# Patient Record
Sex: Female | Born: 1945 | Race: White | Hispanic: No | Smoking: Former smoker
Health system: Southern US, Community
[De-identification: ages and names within clinical notes are randomized; demographics above are authoritative.]

---

## 1999-05-30 ENCOUNTER — Encounter: Payer: Self-pay | Admitting: Obstetrics and Gynecology

## 1999-05-30 ENCOUNTER — Encounter: Admission: RE | Admit: 1999-05-30 | Discharge: 1999-05-30 | Payer: Self-pay | Admitting: Obstetrics and Gynecology

## 2000-09-26 ENCOUNTER — Encounter: Payer: Self-pay | Admitting: Obstetrics and Gynecology

## 2000-09-26 ENCOUNTER — Encounter: Admission: RE | Admit: 2000-09-26 | Discharge: 2000-09-26 | Payer: Self-pay | Admitting: Obstetrics and Gynecology

## 2002-01-29 ENCOUNTER — Encounter: Payer: Self-pay | Admitting: Obstetrics and Gynecology

## 2002-01-29 ENCOUNTER — Encounter: Admission: RE | Admit: 2002-01-29 | Discharge: 2002-01-29 | Payer: Self-pay | Admitting: Obstetrics and Gynecology

## 2003-02-23 ENCOUNTER — Encounter: Admission: RE | Admit: 2003-02-23 | Discharge: 2003-02-23 | Payer: Self-pay | Admitting: Obstetrics and Gynecology

## 2004-06-22 ENCOUNTER — Encounter: Admission: RE | Admit: 2004-06-22 | Discharge: 2004-06-22 | Payer: Self-pay | Admitting: Obstetrics and Gynecology

## 2005-06-28 ENCOUNTER — Encounter: Admission: RE | Admit: 2005-06-28 | Discharge: 2005-06-28 | Payer: Self-pay | Admitting: Obstetrics and Gynecology

## 2006-07-25 ENCOUNTER — Encounter: Admission: RE | Admit: 2006-07-25 | Discharge: 2006-07-25 | Payer: Self-pay | Admitting: Obstetrics and Gynecology

## 2006-08-02 ENCOUNTER — Encounter: Admission: RE | Admit: 2006-08-02 | Discharge: 2006-08-02 | Payer: Self-pay | Admitting: Obstetrics and Gynecology

## 2010-05-14 ENCOUNTER — Encounter: Payer: Self-pay | Admitting: Obstetrics and Gynecology

## 2010-05-25 ENCOUNTER — Other Ambulatory Visit: Payer: Self-pay | Admitting: Family Medicine

## 2010-05-25 DIAGNOSIS — Z1239 Encounter for other screening for malignant neoplasm of breast: Secondary | ICD-10-CM

## 2010-06-06 ENCOUNTER — Ambulatory Visit
Admission: RE | Admit: 2010-06-06 | Discharge: 2010-06-06 | Disposition: A | Discharge status: Home / Self Care | Payer: Medicare Other | Source: Ambulatory Visit | Attending: Family Medicine | Admitting: Family Medicine

## 2010-06-06 DIAGNOSIS — Z1239 Encounter for other screening for malignant neoplasm of breast: Secondary | ICD-10-CM

## 2010-06-08 ENCOUNTER — Other Ambulatory Visit (HOSPITAL_COMMUNITY)
Admission: RE | Admit: 2010-06-08 | Discharge: 2010-06-08 | Disposition: A | Discharge status: Home / Self Care | Payer: Medicare Other | Source: Ambulatory Visit | Attending: Obstetrics and Gynecology | Admitting: Obstetrics and Gynecology

## 2010-06-08 ENCOUNTER — Other Ambulatory Visit: Payer: Self-pay | Admitting: Obstetrics and Gynecology

## 2010-06-08 DIAGNOSIS — Z124 Encounter for screening for malignant neoplasm of cervix: Secondary | ICD-10-CM | POA: Insufficient documentation

## 2010-09-14 ENCOUNTER — Other Ambulatory Visit: Payer: Self-pay | Admitting: Gastroenterology

## 2011-03-05 ENCOUNTER — Other Ambulatory Visit: Payer: Self-pay | Admitting: Oral Surgery

## 2011-05-24 DIAGNOSIS — Z23 Encounter for immunization: Secondary | ICD-10-CM | POA: Diagnosis not present

## 2011-05-24 DIAGNOSIS — L439 Lichen planus, unspecified: Secondary | ICD-10-CM | POA: Diagnosis not present

## 2011-05-24 DIAGNOSIS — Z79899 Other long term (current) drug therapy: Secondary | ICD-10-CM | POA: Diagnosis not present

## 2011-05-24 DIAGNOSIS — Z0000 Encounter for general adult medical examination without abnormal findings: Secondary | ICD-10-CM | POA: Diagnosis not present

## 2011-05-24 DIAGNOSIS — Z13220 Encounter for screening for lipoid disorders: Secondary | ICD-10-CM | POA: Diagnosis not present

## 2011-05-25 ENCOUNTER — Other Ambulatory Visit: Payer: Self-pay | Admitting: Family Medicine

## 2011-05-25 DIAGNOSIS — Z1231 Encounter for screening mammogram for malignant neoplasm of breast: Secondary | ICD-10-CM

## 2011-06-04 DIAGNOSIS — E875 Hyperkalemia: Secondary | ICD-10-CM | POA: Diagnosis not present

## 2011-06-12 ENCOUNTER — Ambulatory Visit
Admission: RE | Admit: 2011-06-12 | Discharge: 2011-06-12 | Disposition: A | Discharge status: Home / Self Care | Payer: PRIVATE HEALTH INSURANCE | Source: Ambulatory Visit | Attending: Family Medicine | Admitting: Family Medicine

## 2011-06-12 DIAGNOSIS — Z1231 Encounter for screening mammogram for malignant neoplasm of breast: Secondary | ICD-10-CM | POA: Diagnosis not present

## 2011-06-12 DIAGNOSIS — N951 Menopausal and female climacteric states: Secondary | ICD-10-CM | POA: Diagnosis not present

## 2011-06-12 DIAGNOSIS — Z13820 Encounter for screening for osteoporosis: Secondary | ICD-10-CM | POA: Diagnosis not present

## 2011-11-05 DIAGNOSIS — H00029 Hordeolum internum unspecified eye, unspecified eyelid: Secondary | ICD-10-CM | POA: Diagnosis not present

## 2011-11-13 DIAGNOSIS — M6740 Ganglion, unspecified site: Secondary | ICD-10-CM | POA: Diagnosis not present

## 2011-11-13 DIAGNOSIS — B070 Plantar wart: Secondary | ICD-10-CM | POA: Diagnosis not present

## 2011-11-22 DIAGNOSIS — E7800 Pure hypercholesterolemia, unspecified: Secondary | ICD-10-CM | POA: Diagnosis not present

## 2012-03-25 ENCOUNTER — Ambulatory Visit (INDEPENDENT_AMBULATORY_CARE_PROVIDER_SITE_OTHER): Payer: Self-pay

## 2012-03-25 DIAGNOSIS — F4320 Adjustment disorder, unspecified: Secondary | ICD-10-CM

## 2012-07-21 ENCOUNTER — Other Ambulatory Visit: Payer: Self-pay

## 2012-07-21 DIAGNOSIS — R030 Elevated blood-pressure reading, without diagnosis of hypertension: Secondary | ICD-10-CM | POA: Diagnosis not present

## 2012-07-21 DIAGNOSIS — Z1231 Encounter for screening mammogram for malignant neoplasm of breast: Secondary | ICD-10-CM

## 2012-07-21 DIAGNOSIS — G479 Sleep disorder, unspecified: Secondary | ICD-10-CM | POA: Diagnosis not present

## 2012-07-21 DIAGNOSIS — Z23 Encounter for immunization: Secondary | ICD-10-CM | POA: Diagnosis not present

## 2012-07-21 DIAGNOSIS — E7800 Pure hypercholesterolemia, unspecified: Secondary | ICD-10-CM | POA: Diagnosis not present

## 2012-07-21 DIAGNOSIS — Z0000 Encounter for general adult medical examination without abnormal findings: Secondary | ICD-10-CM | POA: Diagnosis not present

## 2012-07-21 DIAGNOSIS — L439 Lichen planus, unspecified: Secondary | ICD-10-CM | POA: Diagnosis not present

## 2012-09-01 ENCOUNTER — Ambulatory Visit
Admission: RE | Admit: 2012-09-01 | Discharge: 2012-09-01 | Disposition: A | Discharge status: Home / Self Care | Payer: Medicare Other | Source: Ambulatory Visit

## 2012-09-01 DIAGNOSIS — Z1231 Encounter for screening mammogram for malignant neoplasm of breast: Secondary | ICD-10-CM | POA: Diagnosis not present

## 2012-12-10 DIAGNOSIS — E7800 Pure hypercholesterolemia, unspecified: Secondary | ICD-10-CM | POA: Diagnosis not present

## 2013-05-06 DIAGNOSIS — J029 Acute pharyngitis, unspecified: Secondary | ICD-10-CM | POA: Diagnosis not present

## 2013-05-06 DIAGNOSIS — R509 Fever, unspecified: Secondary | ICD-10-CM | POA: Diagnosis not present

## 2013-05-06 DIAGNOSIS — L439 Lichen planus, unspecified: Secondary | ICD-10-CM | POA: Diagnosis not present

## 2013-05-06 DIAGNOSIS — L280 Lichen simplex chronicus: Secondary | ICD-10-CM | POA: Diagnosis not present

## 2013-07-27 DIAGNOSIS — Z0000 Encounter for general adult medical examination without abnormal findings: Secondary | ICD-10-CM | POA: Diagnosis not present

## 2013-07-27 DIAGNOSIS — G479 Sleep disorder, unspecified: Secondary | ICD-10-CM | POA: Diagnosis not present

## 2013-07-27 DIAGNOSIS — R030 Elevated blood-pressure reading, without diagnosis of hypertension: Secondary | ICD-10-CM | POA: Diagnosis not present

## 2013-07-27 DIAGNOSIS — E7800 Pure hypercholesterolemia, unspecified: Secondary | ICD-10-CM | POA: Diagnosis not present

## 2013-07-27 DIAGNOSIS — L439 Lichen planus, unspecified: Secondary | ICD-10-CM | POA: Diagnosis not present

## 2013-07-27 DIAGNOSIS — Z23 Encounter for immunization: Secondary | ICD-10-CM | POA: Diagnosis not present

## 2013-07-28 ENCOUNTER — Other Ambulatory Visit: Payer: Self-pay

## 2013-07-28 DIAGNOSIS — Z1231 Encounter for screening mammogram for malignant neoplasm of breast: Secondary | ICD-10-CM

## 2013-08-18 DIAGNOSIS — L439 Lichen planus, unspecified: Secondary | ICD-10-CM | POA: Diagnosis not present

## 2013-08-18 DIAGNOSIS — K130 Diseases of lips: Secondary | ICD-10-CM | POA: Diagnosis not present

## 2013-08-18 DIAGNOSIS — L738 Other specified follicular disorders: Secondary | ICD-10-CM | POA: Diagnosis not present

## 2013-08-20 DIAGNOSIS — L819 Disorder of pigmentation, unspecified: Secondary | ICD-10-CM | POA: Diagnosis not present

## 2013-08-20 DIAGNOSIS — D1801 Hemangioma of skin and subcutaneous tissue: Secondary | ICD-10-CM | POA: Diagnosis not present

## 2013-08-20 DIAGNOSIS — D239 Other benign neoplasm of skin, unspecified: Secondary | ICD-10-CM | POA: Diagnosis not present

## 2013-08-20 DIAGNOSIS — L719 Rosacea, unspecified: Secondary | ICD-10-CM | POA: Diagnosis not present

## 2013-09-07 ENCOUNTER — Encounter (INDEPENDENT_AMBULATORY_CARE_PROVIDER_SITE_OTHER): Payer: Self-pay

## 2013-09-07 ENCOUNTER — Ambulatory Visit
Admission: RE | Admit: 2013-09-07 | Discharge: 2013-09-07 | Disposition: A | Discharge status: Home / Self Care | Payer: Medicare Other | Source: Ambulatory Visit

## 2013-09-07 DIAGNOSIS — Z1231 Encounter for screening mammogram for malignant neoplasm of breast: Secondary | ICD-10-CM | POA: Diagnosis not present

## 2014-03-26 DIAGNOSIS — R197 Diarrhea, unspecified: Secondary | ICD-10-CM | POA: Diagnosis not present

## 2014-03-26 DIAGNOSIS — B029 Zoster without complications: Secondary | ICD-10-CM | POA: Diagnosis not present

## 2014-03-26 DIAGNOSIS — Z23 Encounter for immunization: Secondary | ICD-10-CM | POA: Diagnosis not present

## 2014-06-08 DIAGNOSIS — H2513 Age-related nuclear cataract, bilateral: Secondary | ICD-10-CM | POA: Diagnosis not present

## 2014-07-30 ENCOUNTER — Other Ambulatory Visit: Payer: Self-pay

## 2014-07-30 DIAGNOSIS — Z1231 Encounter for screening mammogram for malignant neoplasm of breast: Secondary | ICD-10-CM

## 2014-08-05 DIAGNOSIS — J309 Allergic rhinitis, unspecified: Secondary | ICD-10-CM | POA: Diagnosis not present

## 2014-08-05 DIAGNOSIS — R509 Fever, unspecified: Secondary | ICD-10-CM | POA: Diagnosis not present

## 2014-08-05 DIAGNOSIS — Z0001 Encounter for general adult medical examination with abnormal findings: Secondary | ICD-10-CM | POA: Diagnosis not present

## 2014-08-05 DIAGNOSIS — B349 Viral infection, unspecified: Secondary | ICD-10-CM | POA: Diagnosis not present

## 2014-08-05 DIAGNOSIS — E780 Pure hypercholesterolemia: Secondary | ICD-10-CM | POA: Diagnosis not present

## 2014-08-05 DIAGNOSIS — I10 Essential (primary) hypertension: Secondary | ICD-10-CM | POA: Diagnosis not present

## 2014-08-05 DIAGNOSIS — L439 Lichen planus, unspecified: Secondary | ICD-10-CM | POA: Diagnosis not present

## 2014-08-05 DIAGNOSIS — R7301 Impaired fasting glucose: Secondary | ICD-10-CM | POA: Diagnosis not present

## 2014-08-05 DIAGNOSIS — K580 Irritable bowel syndrome with diarrhea: Secondary | ICD-10-CM | POA: Diagnosis not present

## 2014-08-11 ENCOUNTER — Ambulatory Visit: Payer: Self-pay

## 2014-08-19 DIAGNOSIS — D1801 Hemangioma of skin and subcutaneous tissue: Secondary | ICD-10-CM | POA: Diagnosis not present

## 2014-08-19 DIAGNOSIS — K130 Diseases of lips: Secondary | ICD-10-CM | POA: Diagnosis not present

## 2014-08-19 DIAGNOSIS — D225 Melanocytic nevi of trunk: Secondary | ICD-10-CM | POA: Diagnosis not present

## 2014-08-19 DIAGNOSIS — L814 Other melanin hyperpigmentation: Secondary | ICD-10-CM | POA: Diagnosis not present

## 2014-08-19 DIAGNOSIS — L433 Subacute (active) lichen planus: Secondary | ICD-10-CM | POA: Diagnosis not present

## 2014-08-19 DIAGNOSIS — D2272 Melanocytic nevi of left lower limb, including hip: Secondary | ICD-10-CM | POA: Diagnosis not present

## 2014-09-10 ENCOUNTER — Ambulatory Visit
Admission: RE | Admit: 2014-09-10 | Discharge: 2014-09-10 | Disposition: A | Discharge status: Home / Self Care | Payer: Medicare Other | Source: Ambulatory Visit

## 2014-09-10 DIAGNOSIS — Z1231 Encounter for screening mammogram for malignant neoplasm of breast: Secondary | ICD-10-CM

## 2015-02-04 DIAGNOSIS — I10 Essential (primary) hypertension: Secondary | ICD-10-CM | POA: Diagnosis not present

## 2015-02-04 DIAGNOSIS — R7301 Impaired fasting glucose: Secondary | ICD-10-CM | POA: Diagnosis not present

## 2015-02-04 DIAGNOSIS — E7800 Pure hypercholesterolemia, unspecified: Secondary | ICD-10-CM | POA: Diagnosis not present

## 2015-02-04 DIAGNOSIS — Z23 Encounter for immunization: Secondary | ICD-10-CM | POA: Diagnosis not present

## 2015-02-18 DIAGNOSIS — N179 Acute kidney failure, unspecified: Secondary | ICD-10-CM | POA: Diagnosis not present

## 2015-05-12 DIAGNOSIS — E782 Mixed hyperlipidemia: Secondary | ICD-10-CM | POA: Diagnosis not present

## 2015-08-09 ENCOUNTER — Other Ambulatory Visit: Payer: Self-pay

## 2015-08-09 DIAGNOSIS — Z1231 Encounter for screening mammogram for malignant neoplasm of breast: Secondary | ICD-10-CM

## 2015-08-18 DIAGNOSIS — D225 Melanocytic nevi of trunk: Secondary | ICD-10-CM | POA: Diagnosis not present

## 2015-08-18 DIAGNOSIS — L853 Xerosis cutis: Secondary | ICD-10-CM | POA: Diagnosis not present

## 2015-08-18 DIAGNOSIS — L814 Other melanin hyperpigmentation: Secondary | ICD-10-CM | POA: Diagnosis not present

## 2015-08-18 DIAGNOSIS — L433 Subacute (active) lichen planus: Secondary | ICD-10-CM | POA: Diagnosis not present

## 2015-08-18 DIAGNOSIS — L821 Other seborrheic keratosis: Secondary | ICD-10-CM | POA: Diagnosis not present

## 2015-08-18 DIAGNOSIS — D1801 Hemangioma of skin and subcutaneous tissue: Secondary | ICD-10-CM | POA: Diagnosis not present

## 2015-08-25 DIAGNOSIS — J309 Allergic rhinitis, unspecified: Secondary | ICD-10-CM | POA: Diagnosis not present

## 2015-08-25 DIAGNOSIS — K580 Irritable bowel syndrome with diarrhea: Secondary | ICD-10-CM | POA: Diagnosis not present

## 2015-08-25 DIAGNOSIS — R7301 Impaired fasting glucose: Secondary | ICD-10-CM | POA: Diagnosis not present

## 2015-08-25 DIAGNOSIS — E782 Mixed hyperlipidemia: Secondary | ICD-10-CM | POA: Diagnosis not present

## 2015-08-25 DIAGNOSIS — I10 Essential (primary) hypertension: Secondary | ICD-10-CM | POA: Diagnosis not present

## 2015-08-25 DIAGNOSIS — L439 Lichen planus, unspecified: Secondary | ICD-10-CM | POA: Diagnosis not present

## 2015-08-25 DIAGNOSIS — Z0000 Encounter for general adult medical examination without abnormal findings: Secondary | ICD-10-CM | POA: Diagnosis not present

## 2015-09-07 DIAGNOSIS — H524 Presbyopia: Secondary | ICD-10-CM | POA: Diagnosis not present

## 2015-09-07 DIAGNOSIS — H5203 Hypermetropia, bilateral: Secondary | ICD-10-CM | POA: Diagnosis not present

## 2015-09-07 DIAGNOSIS — N179 Acute kidney failure, unspecified: Secondary | ICD-10-CM | POA: Diagnosis not present

## 2015-09-07 DIAGNOSIS — H52203 Unspecified astigmatism, bilateral: Secondary | ICD-10-CM | POA: Diagnosis not present

## 2015-09-07 DIAGNOSIS — H2513 Age-related nuclear cataract, bilateral: Secondary | ICD-10-CM | POA: Diagnosis not present

## 2015-09-07 DIAGNOSIS — N183 Chronic kidney disease, stage 3 (moderate): Secondary | ICD-10-CM | POA: Diagnosis not present

## 2015-09-09 DIAGNOSIS — Z86010 Personal history of colonic polyps: Secondary | ICD-10-CM | POA: Diagnosis not present

## 2015-09-09 DIAGNOSIS — R197 Diarrhea, unspecified: Secondary | ICD-10-CM | POA: Diagnosis not present

## 2015-09-13 ENCOUNTER — Ambulatory Visit
Admission: RE | Admit: 2015-09-13 | Discharge: 2015-09-13 | Disposition: A | Discharge status: Home / Self Care | Payer: Medicare Other | Source: Ambulatory Visit

## 2015-09-13 DIAGNOSIS — Z1231 Encounter for screening mammogram for malignant neoplasm of breast: Secondary | ICD-10-CM

## 2015-09-16 ENCOUNTER — Other Ambulatory Visit: Payer: Self-pay | Admitting: Gastroenterology

## 2015-09-16 DIAGNOSIS — K648 Other hemorrhoids: Secondary | ICD-10-CM | POA: Diagnosis not present

## 2015-09-16 DIAGNOSIS — D126 Benign neoplasm of colon, unspecified: Secondary | ICD-10-CM | POA: Diagnosis not present

## 2015-09-16 DIAGNOSIS — D122 Benign neoplasm of ascending colon: Secondary | ICD-10-CM | POA: Diagnosis not present

## 2015-09-16 DIAGNOSIS — K5730 Diverticulosis of large intestine without perforation or abscess without bleeding: Secondary | ICD-10-CM | POA: Diagnosis not present

## 2015-09-16 DIAGNOSIS — R197 Diarrhea, unspecified: Secondary | ICD-10-CM | POA: Diagnosis not present

## 2015-09-16 DIAGNOSIS — D123 Benign neoplasm of transverse colon: Secondary | ICD-10-CM | POA: Diagnosis not present

## 2016-03-01 DIAGNOSIS — K580 Irritable bowel syndrome with diarrhea: Secondary | ICD-10-CM | POA: Diagnosis not present

## 2016-03-01 DIAGNOSIS — L439 Lichen planus, unspecified: Secondary | ICD-10-CM | POA: Diagnosis not present

## 2016-03-01 DIAGNOSIS — R7301 Impaired fasting glucose: Secondary | ICD-10-CM | POA: Diagnosis not present

## 2016-03-01 DIAGNOSIS — Z23 Encounter for immunization: Secondary | ICD-10-CM | POA: Diagnosis not present

## 2016-03-01 DIAGNOSIS — E782 Mixed hyperlipidemia: Secondary | ICD-10-CM | POA: Diagnosis not present

## 2016-03-01 DIAGNOSIS — I10 Essential (primary) hypertension: Secondary | ICD-10-CM | POA: Diagnosis not present

## 2016-08-06 ENCOUNTER — Other Ambulatory Visit: Payer: Self-pay | Admitting: Family Medicine

## 2016-08-06 DIAGNOSIS — Z1231 Encounter for screening mammogram for malignant neoplasm of breast: Secondary | ICD-10-CM

## 2016-08-29 DIAGNOSIS — K580 Irritable bowel syndrome with diarrhea: Secondary | ICD-10-CM | POA: Diagnosis not present

## 2016-08-29 DIAGNOSIS — Z0000 Encounter for general adult medical examination without abnormal findings: Secondary | ICD-10-CM | POA: Diagnosis not present

## 2016-08-29 DIAGNOSIS — I10 Essential (primary) hypertension: Secondary | ICD-10-CM | POA: Diagnosis not present

## 2016-08-29 DIAGNOSIS — R7301 Impaired fasting glucose: Secondary | ICD-10-CM | POA: Diagnosis not present

## 2016-08-29 DIAGNOSIS — E782 Mixed hyperlipidemia: Secondary | ICD-10-CM | POA: Diagnosis not present

## 2016-08-29 DIAGNOSIS — L439 Lichen planus, unspecified: Secondary | ICD-10-CM | POA: Diagnosis not present

## 2016-08-29 DIAGNOSIS — J309 Allergic rhinitis, unspecified: Secondary | ICD-10-CM | POA: Diagnosis not present

## 2016-08-29 DIAGNOSIS — Z1159 Encounter for screening for other viral diseases: Secondary | ICD-10-CM | POA: Diagnosis not present

## 2016-09-13 ENCOUNTER — Ambulatory Visit
Admission: RE | Admit: 2016-09-13 | Discharge: 2016-09-13 | Disposition: A | Discharge status: Home / Self Care | Payer: Medicare Other | Source: Ambulatory Visit | Attending: Family Medicine | Admitting: Family Medicine

## 2016-09-13 DIAGNOSIS — Z1231 Encounter for screening mammogram for malignant neoplasm of breast: Secondary | ICD-10-CM

## 2016-09-13 DIAGNOSIS — D1801 Hemangioma of skin and subcutaneous tissue: Secondary | ICD-10-CM | POA: Diagnosis not present

## 2016-09-13 DIAGNOSIS — L433 Subacute (active) lichen planus: Secondary | ICD-10-CM | POA: Diagnosis not present

## 2016-09-13 DIAGNOSIS — D225 Melanocytic nevi of trunk: Secondary | ICD-10-CM | POA: Diagnosis not present

## 2016-09-13 DIAGNOSIS — L821 Other seborrheic keratosis: Secondary | ICD-10-CM | POA: Diagnosis not present

## 2016-09-13 DIAGNOSIS — L814 Other melanin hyperpigmentation: Secondary | ICD-10-CM | POA: Diagnosis not present

## 2016-11-08 DIAGNOSIS — Z780 Asymptomatic menopausal state: Secondary | ICD-10-CM | POA: Diagnosis not present

## 2016-11-08 DIAGNOSIS — M8588 Other specified disorders of bone density and structure, other site: Secondary | ICD-10-CM | POA: Diagnosis not present

## 2017-03-29 DIAGNOSIS — R7301 Impaired fasting glucose: Secondary | ICD-10-CM | POA: Diagnosis not present

## 2017-03-29 DIAGNOSIS — E782 Mixed hyperlipidemia: Secondary | ICD-10-CM | POA: Diagnosis not present

## 2017-03-29 DIAGNOSIS — R202 Paresthesia of skin: Secondary | ICD-10-CM | POA: Diagnosis not present

## 2017-03-29 DIAGNOSIS — I10 Essential (primary) hypertension: Secondary | ICD-10-CM | POA: Diagnosis not present

## 2017-03-29 DIAGNOSIS — Z23 Encounter for immunization: Secondary | ICD-10-CM | POA: Diagnosis not present

## 2017-08-22 ENCOUNTER — Other Ambulatory Visit: Payer: Self-pay | Admitting: Advanced Practice Midwife

## 2017-08-22 DIAGNOSIS — Z1231 Encounter for screening mammogram for malignant neoplasm of breast: Secondary | ICD-10-CM

## 2017-09-12 DIAGNOSIS — K529 Noninfective gastroenteritis and colitis, unspecified: Secondary | ICD-10-CM | POA: Diagnosis not present

## 2017-09-12 DIAGNOSIS — I10 Essential (primary) hypertension: Secondary | ICD-10-CM | POA: Diagnosis not present

## 2017-09-12 DIAGNOSIS — L439 Lichen planus, unspecified: Secondary | ICD-10-CM | POA: Diagnosis not present

## 2017-09-12 DIAGNOSIS — J309 Allergic rhinitis, unspecified: Secondary | ICD-10-CM | POA: Diagnosis not present

## 2017-09-12 DIAGNOSIS — K580 Irritable bowel syndrome with diarrhea: Secondary | ICD-10-CM | POA: Diagnosis not present

## 2017-09-12 DIAGNOSIS — E782 Mixed hyperlipidemia: Secondary | ICD-10-CM | POA: Diagnosis not present

## 2017-09-12 DIAGNOSIS — Z0000 Encounter for general adult medical examination without abnormal findings: Secondary | ICD-10-CM | POA: Diagnosis not present

## 2017-09-12 DIAGNOSIS — R7301 Impaired fasting glucose: Secondary | ICD-10-CM | POA: Diagnosis not present

## 2017-09-18 DIAGNOSIS — D1801 Hemangioma of skin and subcutaneous tissue: Secondary | ICD-10-CM | POA: Diagnosis not present

## 2017-09-18 DIAGNOSIS — D225 Melanocytic nevi of trunk: Secondary | ICD-10-CM | POA: Diagnosis not present

## 2017-09-18 DIAGNOSIS — L821 Other seborrheic keratosis: Secondary | ICD-10-CM | POA: Diagnosis not present

## 2017-09-18 DIAGNOSIS — D692 Other nonthrombocytopenic purpura: Secondary | ICD-10-CM | POA: Diagnosis not present

## 2017-09-18 DIAGNOSIS — L433 Subacute (active) lichen planus: Secondary | ICD-10-CM | POA: Diagnosis not present

## 2017-09-18 DIAGNOSIS — D224 Melanocytic nevi of scalp and neck: Secondary | ICD-10-CM | POA: Diagnosis not present

## 2017-09-19 ENCOUNTER — Ambulatory Visit
Admission: RE | Admit: 2017-09-19 | Discharge: 2017-09-19 | Disposition: A | Discharge status: Home / Self Care | Payer: Medicare Other | Source: Ambulatory Visit | Attending: Advanced Practice Midwife | Admitting: Advanced Practice Midwife

## 2017-09-19 DIAGNOSIS — Z1231 Encounter for screening mammogram for malignant neoplasm of breast: Secondary | ICD-10-CM | POA: Diagnosis not present

## 2018-01-01 DIAGNOSIS — Z23 Encounter for immunization: Secondary | ICD-10-CM | POA: Diagnosis not present

## 2018-03-17 DIAGNOSIS — E782 Mixed hyperlipidemia: Secondary | ICD-10-CM | POA: Diagnosis not present

## 2018-03-17 DIAGNOSIS — I10 Essential (primary) hypertension: Secondary | ICD-10-CM | POA: Diagnosis not present

## 2018-03-17 DIAGNOSIS — F4321 Adjustment disorder with depressed mood: Secondary | ICD-10-CM | POA: Diagnosis not present

## 2018-03-17 DIAGNOSIS — R7301 Impaired fasting glucose: Secondary | ICD-10-CM | POA: Diagnosis not present

## 2018-03-17 DIAGNOSIS — M791 Myalgia, unspecified site: Secondary | ICD-10-CM | POA: Diagnosis not present

## 2018-03-31 DIAGNOSIS — N179 Acute kidney failure, unspecified: Secondary | ICD-10-CM | POA: Diagnosis not present

## 2018-05-01 DIAGNOSIS — H5203 Hypermetropia, bilateral: Secondary | ICD-10-CM | POA: Diagnosis not present

## 2018-05-01 DIAGNOSIS — H2513 Age-related nuclear cataract, bilateral: Secondary | ICD-10-CM | POA: Diagnosis not present

## 2018-06-18 DIAGNOSIS — H25812 Combined forms of age-related cataract, left eye: Secondary | ICD-10-CM | POA: Diagnosis not present

## 2018-06-18 DIAGNOSIS — H2512 Age-related nuclear cataract, left eye: Secondary | ICD-10-CM | POA: Diagnosis not present

## 2018-08-18 ENCOUNTER — Other Ambulatory Visit: Payer: Self-pay | Admitting: Family Medicine

## 2018-08-18 DIAGNOSIS — Z1231 Encounter for screening mammogram for malignant neoplasm of breast: Secondary | ICD-10-CM

## 2018-09-09 DIAGNOSIS — D1801 Hemangioma of skin and subcutaneous tissue: Secondary | ICD-10-CM | POA: Diagnosis not present

## 2018-09-09 DIAGNOSIS — L821 Other seborrheic keratosis: Secondary | ICD-10-CM | POA: Diagnosis not present

## 2018-09-09 DIAGNOSIS — L820 Inflamed seborrheic keratosis: Secondary | ICD-10-CM | POA: Diagnosis not present

## 2018-09-09 DIAGNOSIS — D225 Melanocytic nevi of trunk: Secondary | ICD-10-CM | POA: Diagnosis not present

## 2018-09-09 DIAGNOSIS — L814 Other melanin hyperpigmentation: Secondary | ICD-10-CM | POA: Diagnosis not present

## 2018-09-11 DIAGNOSIS — E782 Mixed hyperlipidemia: Secondary | ICD-10-CM | POA: Diagnosis not present

## 2018-09-11 DIAGNOSIS — K580 Irritable bowel syndrome with diarrhea: Secondary | ICD-10-CM | POA: Diagnosis not present

## 2018-09-11 DIAGNOSIS — R7301 Impaired fasting glucose: Secondary | ICD-10-CM | POA: Diagnosis not present

## 2018-09-11 DIAGNOSIS — I10 Essential (primary) hypertension: Secondary | ICD-10-CM | POA: Diagnosis not present

## 2018-09-17 DIAGNOSIS — L439 Lichen planus, unspecified: Secondary | ICD-10-CM | POA: Diagnosis not present

## 2018-09-17 DIAGNOSIS — K580 Irritable bowel syndrome with diarrhea: Secondary | ICD-10-CM | POA: Diagnosis not present

## 2018-09-17 DIAGNOSIS — E782 Mixed hyperlipidemia: Secondary | ICD-10-CM | POA: Diagnosis not present

## 2018-09-17 DIAGNOSIS — J309 Allergic rhinitis, unspecified: Secondary | ICD-10-CM | POA: Diagnosis not present

## 2018-09-17 DIAGNOSIS — Z0000 Encounter for general adult medical examination without abnormal findings: Secondary | ICD-10-CM | POA: Diagnosis not present

## 2018-09-17 DIAGNOSIS — I10 Essential (primary) hypertension: Secondary | ICD-10-CM | POA: Diagnosis not present

## 2018-09-17 DIAGNOSIS — R7301 Impaired fasting glucose: Secondary | ICD-10-CM | POA: Diagnosis not present

## 2018-09-17 DIAGNOSIS — G4700 Insomnia, unspecified: Secondary | ICD-10-CM | POA: Diagnosis not present

## 2018-11-28 ENCOUNTER — Ambulatory Visit
Admission: RE | Admit: 2018-11-28 | Discharge: 2018-11-28 | Disposition: A | Discharge status: Home / Self Care | Payer: Medicare Other | Source: Ambulatory Visit | Attending: Family Medicine | Admitting: Family Medicine

## 2018-11-28 ENCOUNTER — Other Ambulatory Visit: Payer: Self-pay

## 2018-11-28 DIAGNOSIS — Z1231 Encounter for screening mammogram for malignant neoplasm of breast: Secondary | ICD-10-CM

## 2019-01-02 DIAGNOSIS — H04123 Dry eye syndrome of bilateral lacrimal glands: Secondary | ICD-10-CM | POA: Diagnosis not present

## 2019-01-02 DIAGNOSIS — H2511 Age-related nuclear cataract, right eye: Secondary | ICD-10-CM | POA: Diagnosis not present

## 2019-02-03 DIAGNOSIS — H04123 Dry eye syndrome of bilateral lacrimal glands: Secondary | ICD-10-CM | POA: Diagnosis not present

## 2019-02-11 DIAGNOSIS — H2511 Age-related nuclear cataract, right eye: Secondary | ICD-10-CM | POA: Diagnosis not present

## 2019-02-11 DIAGNOSIS — H25811 Combined forms of age-related cataract, right eye: Secondary | ICD-10-CM | POA: Diagnosis not present

## 2019-03-23 DIAGNOSIS — F4321 Adjustment disorder with depressed mood: Secondary | ICD-10-CM | POA: Diagnosis not present

## 2019-03-23 DIAGNOSIS — K580 Irritable bowel syndrome with diarrhea: Secondary | ICD-10-CM | POA: Diagnosis not present

## 2019-03-23 DIAGNOSIS — I10 Essential (primary) hypertension: Secondary | ICD-10-CM | POA: Diagnosis not present

## 2019-03-23 DIAGNOSIS — Z7189 Other specified counseling: Secondary | ICD-10-CM | POA: Diagnosis not present

## 2019-03-23 DIAGNOSIS — R7301 Impaired fasting glucose: Secondary | ICD-10-CM | POA: Diagnosis not present

## 2019-03-23 DIAGNOSIS — G4700 Insomnia, unspecified: Secondary | ICD-10-CM | POA: Diagnosis not present

## 2019-03-23 DIAGNOSIS — E782 Mixed hyperlipidemia: Secondary | ICD-10-CM | POA: Diagnosis not present

## 2019-09-09 ENCOUNTER — Other Ambulatory Visit: Payer: Self-pay | Admitting: Family Medicine

## 2019-09-09 DIAGNOSIS — Z1231 Encounter for screening mammogram for malignant neoplasm of breast: Secondary | ICD-10-CM

## 2020-01-05 ENCOUNTER — Other Ambulatory Visit: Payer: Self-pay

## 2020-01-05 ENCOUNTER — Ambulatory Visit
Admission: RE | Admit: 2020-01-05 | Discharge: 2020-01-05 | Disposition: A | Discharge status: Home / Self Care | Payer: Medicare Other | Source: Ambulatory Visit | Attending: Family Medicine | Admitting: Family Medicine

## 2020-01-05 DIAGNOSIS — Z1231 Encounter for screening mammogram for malignant neoplasm of breast: Secondary | ICD-10-CM

## 2020-02-09 ENCOUNTER — Other Ambulatory Visit: Payer: Self-pay | Admitting: Family Medicine

## 2020-02-09 DIAGNOSIS — E2839 Other primary ovarian failure: Secondary | ICD-10-CM

## 2020-02-09 DIAGNOSIS — M8580 Other specified disorders of bone density and structure, unspecified site: Secondary | ICD-10-CM

## 2020-03-23 ENCOUNTER — Encounter (HOSPITAL_COMMUNITY): Payer: Self-pay

## 2020-03-23 ENCOUNTER — Other Ambulatory Visit: Payer: Self-pay

## 2020-03-23 ENCOUNTER — Ambulatory Visit (HOSPITAL_COMMUNITY)
Admission: EM | Admit: 2020-03-23 | Discharge: 2020-03-23 | Disposition: A | Discharge status: Home / Self Care | Payer: Medicare Other | Attending: Family Medicine | Admitting: Family Medicine

## 2020-03-23 DIAGNOSIS — J029 Acute pharyngitis, unspecified: Secondary | ICD-10-CM

## 2020-03-23 DIAGNOSIS — Z20822 Contact with and (suspected) exposure to covid-19: Secondary | ICD-10-CM | POA: Diagnosis not present

## 2020-03-23 LAB — SARS CORONAVIRUS 2 (TAT 6-24 HRS): SARS Coronavirus 2: NEGATIVE

## 2020-03-23 NOTE — ED Triage Notes (Signed)
Pt in with c/o sT and productive cough that started on Monday. States she thinks she has sinus infection and her throat is sore and burning. Also states she is coughing up phlegm.  Pt has taken zyrtec for sxs  Denies any runny nose, N/v, diarrhea

## 2020-03-23 NOTE — Discharge Instructions (Addendum)
You may use over the counter ibuprofen or acetaminophen as needed.  For a sore throat, over the counter products such as [employer] Peroxyl Mouth Sore Rinse or Chloraseptic Sore Throat Spray may provide some temporary relief.  You have been tested for COVID-19 today. If your test returns positive, you will receive a phone call from [facility] regarding your results. Negative test results are not called. Both positive and negative results area always visible on MyChart. If you do not have a MyChart account, sign up instructions are provided in your discharge papers. Please do not hesitate to contact us should you have questions or concerns.

## 2020-03-23 NOTE — ED Provider Notes (Signed)
[place]   [id number] 03/23/20 Arrival Time: 2778  ASSESSMENT & PLAN:  1. Sore throat     COVID-19 testing sent. Discussed typical viral illness duration. OTC symptom care as needed.   Follow-up Information    Mayra Neer, MD.   Specialty: Family Medicine Why: As needed. Contact information: [address] [place] [address] [phone]               Reviewed expectations re: course of current medical issues. Questions answered. Outlined signs and symptoms indicating need for more acute intervention. Understanding verbalized. After Visit Summary given.   SUBJECTIVE: History from: patient. Krista Miller is a 74 y.o. female who reports sore throat and mild cough for the past two days. Recent travel to [facility]. No known sick contacts. Denies: fever and difficulty breathing. Normal PO intake without n/v/d.    OBJECTIVE:  Vitals:   03/23/20 1239  BP: 130/64  Pulse: (!) 106  Resp: 19  Temp: 98.3 F (36.8 C)  TempSrc: Oral  SpO2: 95%    General appearance: alert; no distress Eyes: PERRLA; EOMI; conjunctiva normal HENT: Glide; AT; with mild nasal congestion Neck: supple   Lungs: speaks full sentences without difficulty; unlabored Extremities: no edema Skin: warm and dry Neurologic: normal gait Psychological: alert and cooperative; normal mood and affect  Labs: No results found for this or any previous visit. Labs Reviewed  SARS CORONAVIRUS 2 (TAT 6-24 HRS)    Allergies  Allergen Reactions   Codeine Nausea And Vomiting    History reviewed. No pertinent past medical history. Social History   Socioeconomic History   Marital status: Married    Spouse name: Not on file   Number of children: Not on file   Years of education: Not on file   Highest education level: Not on file  Occupational History   Not on file  Tobacco Use   Smoking status: Former Smoker   Smokeless tobacco: Never Used    Scientific laboratory technician Use: Never used  Substance and Sexual Activity   Alcohol use: Not on file   Drug use: Not on file   Sexual activity: Not on file  Other Topics Concern   Not on file  Social History Narrative   Not on file   Social Determinants of Health   Financial Resource Strain:    Difficulty of Paying Living Expenses: Not on file  Food Insecurity:    Worried About [place] in the Last Year: Not on file   [place] in the Last Year: Not on file  Transportation Needs:    Lack of Transportation (Medical): Not on file   Lack of Transportation (Non-Medical): Not on file  Physical Activity:    Days of Exercise per Week: Not on file   Minutes of Exercise per Session: Not on file  Stress:    Feeling of Stress : Not on file  Social Connections:    Frequency of Communication with Friends and Family: Not on file   Frequency of Social Gatherings with Friends and Family: Not on file   Attends Religious Services: Not on file   Active Member of Clubs or Organizations: Not on file   Attends Archivist Meetings: Not on file   Marital Status: Not on file  Intimate Partner Violence:    Fear of Current or Ex-Partner: Not on file   Emotionally Abused: Not on file   Physically Abused: Not on file  Sexually Abused: Not on file   History reviewed. No pertinent family history. History reviewed. No pertinent surgical history.   Vanessa Kick, MD 03/23/20 1324

## 2020-04-27 ENCOUNTER — Ambulatory Visit: Payer: Medicare Other | Attending: Internal Medicine

## 2020-04-27 ENCOUNTER — Other Ambulatory Visit (HOSPITAL_COMMUNITY): Payer: Self-pay | Admitting: Internal Medicine

## 2020-04-27 DIAGNOSIS — Z23 Encounter for immunization: Secondary | ICD-10-CM

## 2020-04-27 NOTE — Progress Notes (Signed)
   Covid-19 Vaccination Clinic  Name:  Rosamae Rocque    MRN: [id number] DOB: 10-02-1945  04/27/2020  Ms. Karen was observed post Covid-19 immunization for 15 minutes without incident. She was provided with Vaccine Information Sheet and instruction to access the V-Safe system.   Ms. Thane was instructed to call 911 with any severe reactions post vaccine: Marland Kitchen Difficulty breathing  . Swelling of face and throat  . A fast heartbeat  . A bad rash all over body  . Dizziness and weakness   Immunizations Administered    Name Date Dose VIS Date Route   Pfizer COVID-19 Vaccine 04/27/2020 10:57 AM 0.3 mL 02/10/2020 Intramuscular   Manufacturer: [employer], [employer]   Lot: [id number]   NDC: [address]-7

## 2020-05-05 ENCOUNTER — Other Ambulatory Visit: Payer: Medicare Other

## 2020-08-01 ENCOUNTER — Other Ambulatory Visit (HOSPITAL_COMMUNITY): Payer: Self-pay

## 2020-08-01 DIAGNOSIS — R7301 Impaired fasting glucose: Secondary | ICD-10-CM | POA: Diagnosis not present

## 2020-08-01 DIAGNOSIS — E782 Mixed hyperlipidemia: Secondary | ICD-10-CM | POA: Diagnosis not present

## 2020-08-01 DIAGNOSIS — I10 Essential (primary) hypertension: Secondary | ICD-10-CM | POA: Diagnosis not present

## 2020-08-01 DIAGNOSIS — G4700 Insomnia, unspecified: Secondary | ICD-10-CM | POA: Diagnosis not present

## 2020-08-01 DIAGNOSIS — M720 Palmar fascial fibromatosis [Dupuytren]: Secondary | ICD-10-CM | POA: Diagnosis not present

## 2020-08-05 ENCOUNTER — Other Ambulatory Visit (HOSPITAL_COMMUNITY): Payer: Self-pay

## 2020-08-23 ENCOUNTER — Ambulatory Visit
Admission: RE | Admit: 2020-08-23 | Discharge: 2020-08-23 | Disposition: A | Discharge status: Home / Self Care | Payer: Medicare Other | Source: Ambulatory Visit | Attending: Family Medicine | Admitting: Family Medicine

## 2020-08-23 ENCOUNTER — Other Ambulatory Visit: Payer: Self-pay

## 2020-08-23 DIAGNOSIS — M85851 Other specified disorders of bone density and structure, right thigh: Secondary | ICD-10-CM | POA: Diagnosis not present

## 2020-08-23 DIAGNOSIS — Z780 Asymptomatic menopausal state: Secondary | ICD-10-CM | POA: Diagnosis not present

## 2020-08-23 DIAGNOSIS — M8580 Other specified disorders of bone density and structure, unspecified site: Secondary | ICD-10-CM

## 2020-08-23 DIAGNOSIS — E2839 Other primary ovarian failure: Secondary | ICD-10-CM

## 2020-09-05 ENCOUNTER — Other Ambulatory Visit (HOSPITAL_COMMUNITY): Payer: Self-pay

## 2020-09-05 IMAGING — MG DIGITAL SCREENING BILATERAL MAMMOGRAM WITH TOMO AND CAD
8 series · 8 of 24 positions shown · non-contrast
Comparison: Previous exam(s).

CLINICAL DATA: Screening.

EXAM:
DIGITAL SCREENING BILATERAL MAMMOGRAM WITH TOMO AND CAD

[R CC synth-2D]
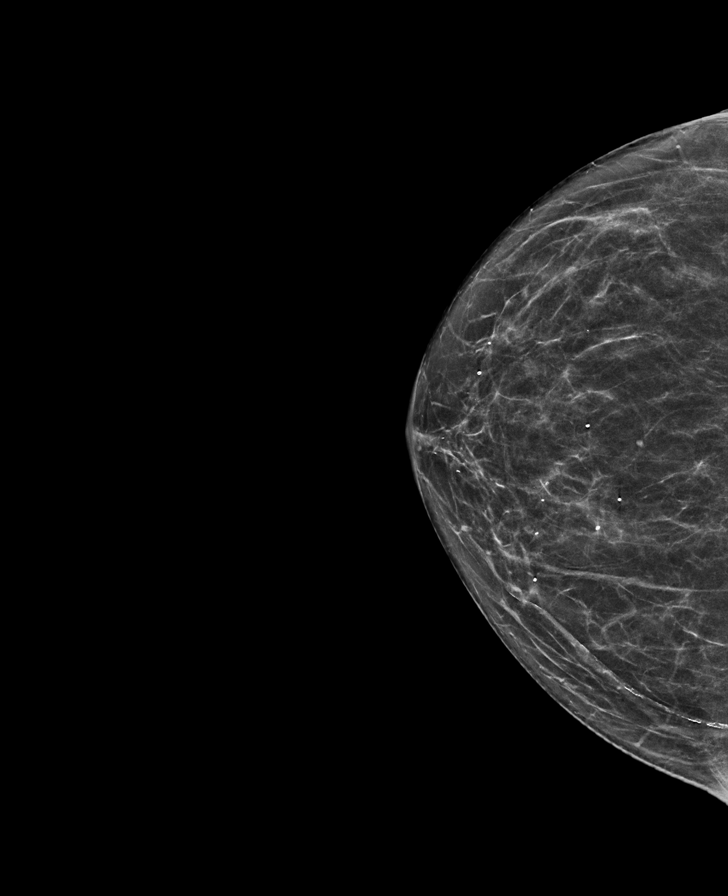

[R MLO synth-2D]
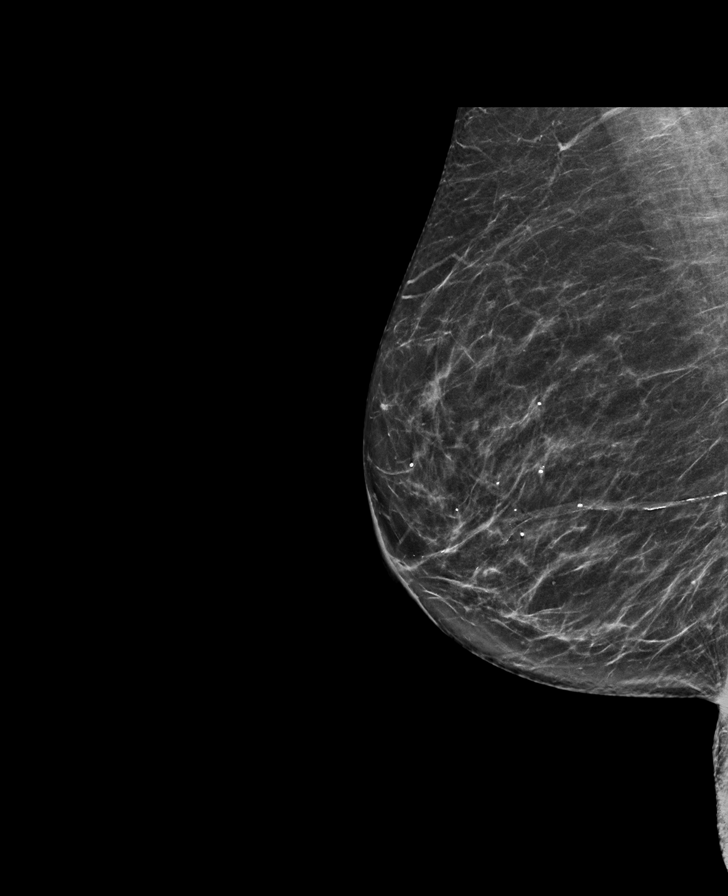

[L MLO synth-2D]
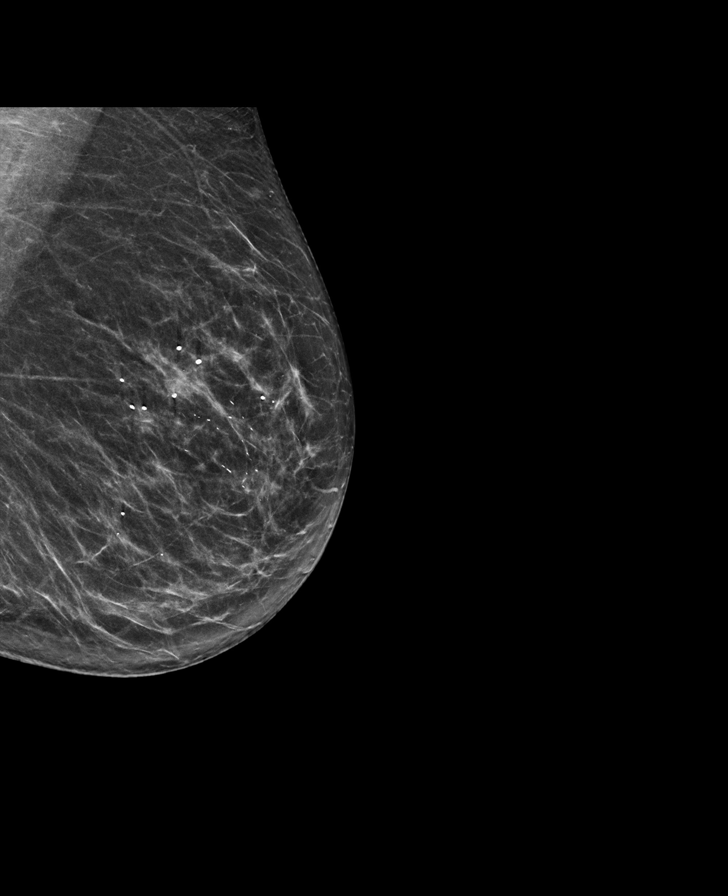

[L CC synth-2D]
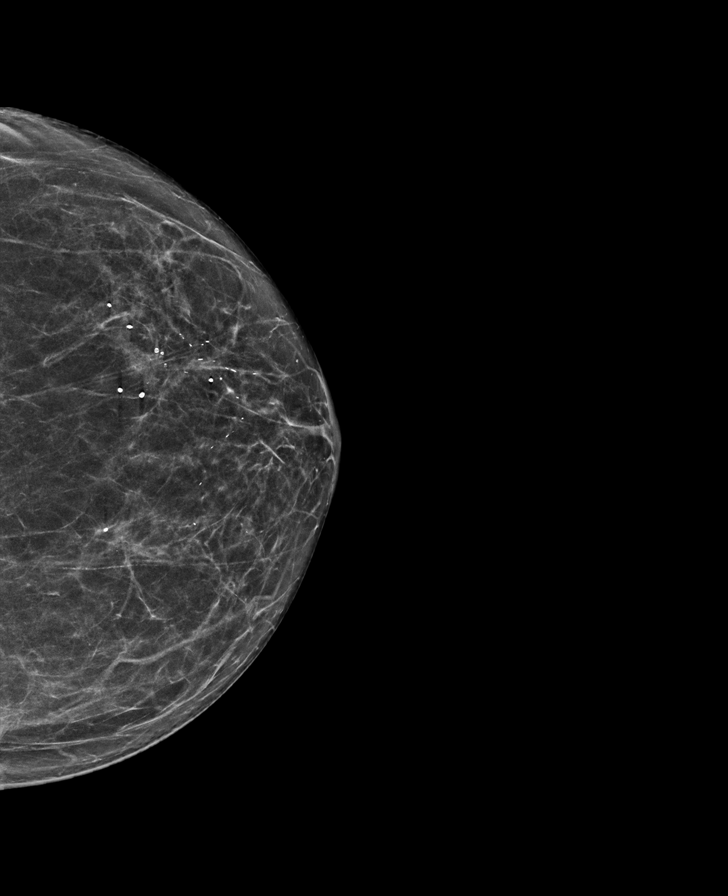

[R MLO tomo · tomo slice 35/69.0]
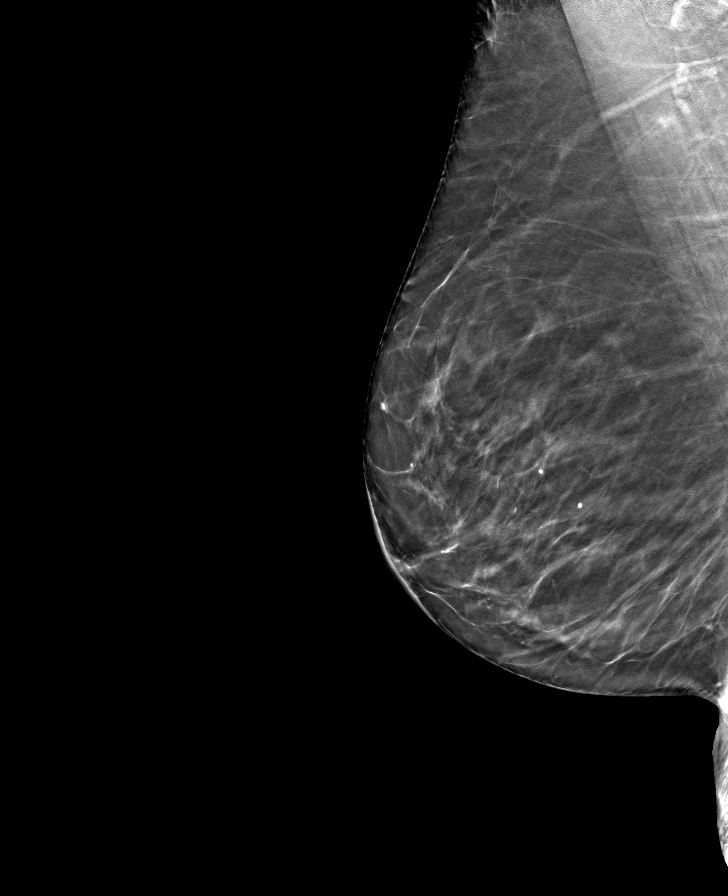

[R CC tomo · tomo slice 31/62.0]
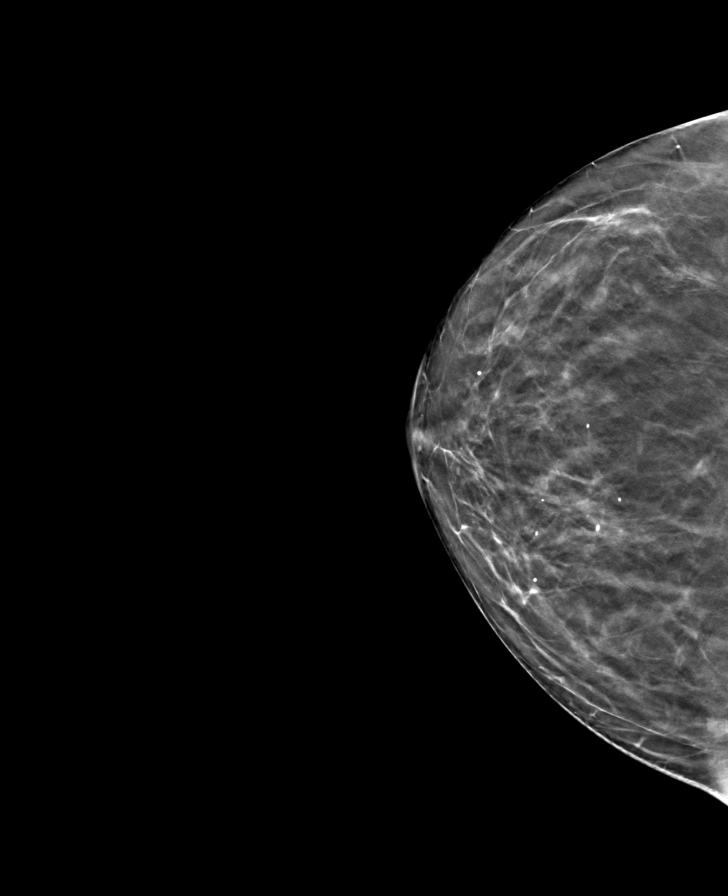

[L CC tomo · tomo slice 33/64.0]
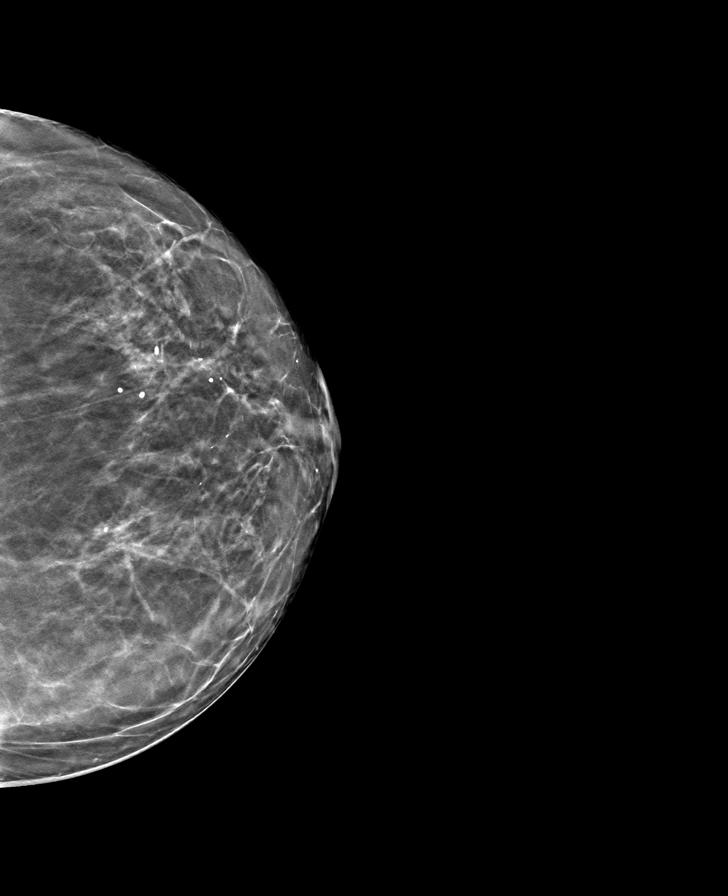

[L MLO tomo · tomo slice 37/72.0]
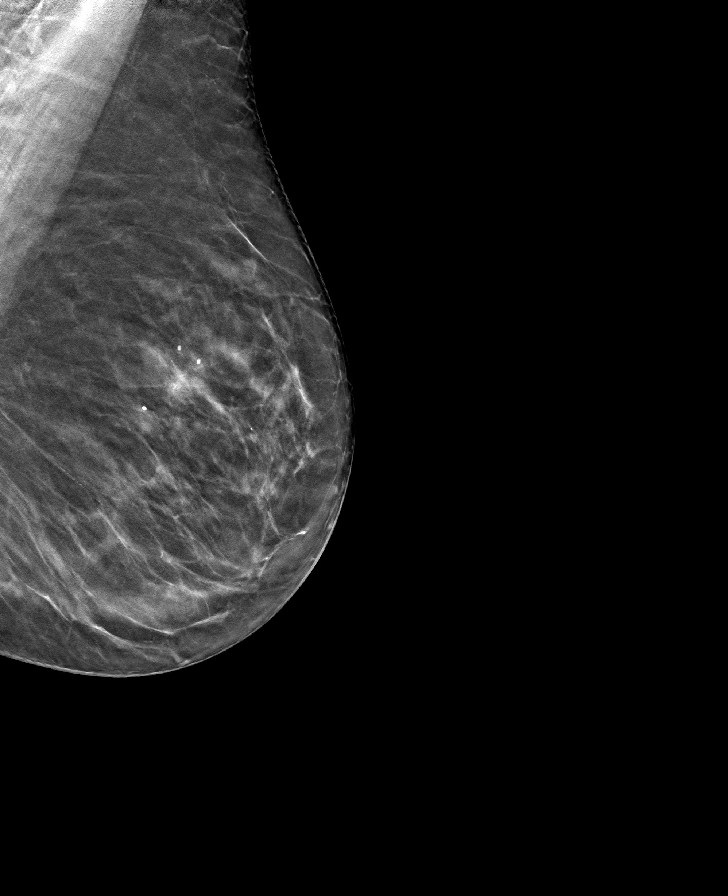

[8 of 24 positions shown; findings below may reference images not displayed]

ACR Breast Density Category b: There are scattered areas of
fibroglandular density.
FINDINGS: There are no findings suspicious for malignancy. Images were
processed with CAD.
IMPRESSION: No mammographic evidence of malignancy. A result letter of this
screening mammogram will be mailed directly to the patient.

RECOMMENDATION:
Screening mammogram in one year. (Code:[id number])

BI-RADS CATEGORY  1: Negative.

## 2020-09-08 ENCOUNTER — Other Ambulatory Visit (HOSPITAL_COMMUNITY): Payer: Self-pay

## 2020-09-08 MED ORDER — LIDOCAINE VISCOUS HCL 2 % MT SOLN
OROMUCOSAL | 0 refills | Status: AC
  Filled 2020-09-08: qty 120, 3d supply, fill #0

## 2020-09-09 ENCOUNTER — Other Ambulatory Visit (HOSPITAL_COMMUNITY): Payer: Self-pay

## 2020-09-22 ENCOUNTER — Other Ambulatory Visit (HOSPITAL_COMMUNITY): Payer: Self-pay

## 2020-10-06 ENCOUNTER — Other Ambulatory Visit (HOSPITAL_COMMUNITY): Payer: Self-pay

## 2021-01-06 DIAGNOSIS — K639 Disease of intestine, unspecified: Secondary | ICD-10-CM | POA: Diagnosis not present

## 2021-01-10 DIAGNOSIS — L718 Other rosacea: Secondary | ICD-10-CM | POA: Diagnosis not present

## 2021-01-10 DIAGNOSIS — D225 Melanocytic nevi of trunk: Secondary | ICD-10-CM | POA: Diagnosis not present

## 2021-01-10 DIAGNOSIS — H00011 Hordeolum externum right upper eyelid: Secondary | ICD-10-CM | POA: Diagnosis not present

## 2021-01-10 DIAGNOSIS — K130 Diseases of lips: Secondary | ICD-10-CM | POA: Diagnosis not present

## 2021-01-10 DIAGNOSIS — D485 Neoplasm of uncertain behavior of skin: Secondary | ICD-10-CM | POA: Diagnosis not present

## 2021-01-10 DIAGNOSIS — L814 Other melanin hyperpigmentation: Secondary | ICD-10-CM | POA: Diagnosis not present

## 2021-01-31 DIAGNOSIS — K5730 Diverticulosis of large intestine without perforation or abscess without bleeding: Secondary | ICD-10-CM | POA: Diagnosis not present

## 2021-01-31 DIAGNOSIS — Z86010 Personal history of colonic polyps: Secondary | ICD-10-CM | POA: Diagnosis not present

## 2021-02-01 DIAGNOSIS — L439 Lichen planus, unspecified: Secondary | ICD-10-CM | POA: Diagnosis not present

## 2021-02-01 DIAGNOSIS — K580 Irritable bowel syndrome with diarrhea: Secondary | ICD-10-CM | POA: Diagnosis not present

## 2021-02-01 DIAGNOSIS — R7301 Impaired fasting glucose: Secondary | ICD-10-CM | POA: Diagnosis not present

## 2021-02-01 DIAGNOSIS — E782 Mixed hyperlipidemia: Secondary | ICD-10-CM | POA: Diagnosis not present

## 2021-02-01 DIAGNOSIS — Z23 Encounter for immunization: Secondary | ICD-10-CM | POA: Diagnosis not present

## 2021-02-01 DIAGNOSIS — Z0000 Encounter for general adult medical examination without abnormal findings: Secondary | ICD-10-CM | POA: Diagnosis not present

## 2021-02-01 DIAGNOSIS — I10 Essential (primary) hypertension: Secondary | ICD-10-CM | POA: Diagnosis not present

## 2021-02-01 DIAGNOSIS — G4700 Insomnia, unspecified: Secondary | ICD-10-CM | POA: Diagnosis not present

## 2021-02-14 DIAGNOSIS — D485 Neoplasm of uncertain behavior of skin: Secondary | ICD-10-CM | POA: Diagnosis not present

## 2021-02-14 DIAGNOSIS — L988 Other specified disorders of the skin and subcutaneous tissue: Secondary | ICD-10-CM | POA: Diagnosis not present

## 2021-02-17 ENCOUNTER — Other Ambulatory Visit: Payer: Self-pay | Admitting: Family Medicine

## 2021-02-17 DIAGNOSIS — Z1231 Encounter for screening mammogram for malignant neoplasm of breast: Secondary | ICD-10-CM

## 2021-03-24 ENCOUNTER — Ambulatory Visit
Admission: RE | Admit: 2021-03-24 | Discharge: 2021-03-24 | Disposition: A | Discharge status: Home / Self Care | Payer: Medicare Other | Source: Ambulatory Visit | Attending: Family Medicine | Admitting: Family Medicine

## 2021-03-24 DIAGNOSIS — Z1231 Encounter for screening mammogram for malignant neoplasm of breast: Secondary | ICD-10-CM | POA: Diagnosis not present

## 2021-04-13 DIAGNOSIS — H04123 Dry eye syndrome of bilateral lacrimal glands: Secondary | ICD-10-CM | POA: Diagnosis not present

## 2021-08-16 DIAGNOSIS — R7301 Impaired fasting glucose: Secondary | ICD-10-CM | POA: Diagnosis not present

## 2021-08-16 DIAGNOSIS — E782 Mixed hyperlipidemia: Secondary | ICD-10-CM | POA: Diagnosis not present

## 2021-08-16 DIAGNOSIS — G4700 Insomnia, unspecified: Secondary | ICD-10-CM | POA: Diagnosis not present

## 2021-08-16 DIAGNOSIS — I10 Essential (primary) hypertension: Secondary | ICD-10-CM | POA: Diagnosis not present

## 2022-01-19 DIAGNOSIS — I872 Venous insufficiency (chronic) (peripheral): Secondary | ICD-10-CM | POA: Diagnosis not present

## 2022-01-19 DIAGNOSIS — L814 Other melanin hyperpigmentation: Secondary | ICD-10-CM | POA: Diagnosis not present

## 2022-01-19 DIAGNOSIS — K130 Diseases of lips: Secondary | ICD-10-CM | POA: Diagnosis not present

## 2022-01-19 DIAGNOSIS — D1801 Hemangioma of skin and subcutaneous tissue: Secondary | ICD-10-CM | POA: Diagnosis not present

## 2022-01-19 DIAGNOSIS — C44321 Squamous cell carcinoma of skin of nose: Secondary | ICD-10-CM | POA: Diagnosis not present

## 2022-01-19 DIAGNOSIS — C44329 Squamous cell carcinoma of skin of other parts of face: Secondary | ICD-10-CM | POA: Diagnosis not present

## 2022-01-19 DIAGNOSIS — D225 Melanocytic nevi of trunk: Secondary | ICD-10-CM | POA: Diagnosis not present

## 2022-01-19 DIAGNOSIS — I8311 Varicose veins of right lower extremity with inflammation: Secondary | ICD-10-CM | POA: Diagnosis not present

## 2022-01-19 DIAGNOSIS — I8312 Varicose veins of left lower extremity with inflammation: Secondary | ICD-10-CM | POA: Diagnosis not present

## 2022-02-12 DIAGNOSIS — Z23 Encounter for immunization: Secondary | ICD-10-CM | POA: Diagnosis not present

## 2022-02-12 DIAGNOSIS — L439 Lichen planus, unspecified: Secondary | ICD-10-CM | POA: Diagnosis not present

## 2022-02-12 DIAGNOSIS — E782 Mixed hyperlipidemia: Secondary | ICD-10-CM | POA: Diagnosis not present

## 2022-02-12 DIAGNOSIS — G4700 Insomnia, unspecified: Secondary | ICD-10-CM | POA: Diagnosis not present

## 2022-02-12 DIAGNOSIS — R7301 Impaired fasting glucose: Secondary | ICD-10-CM | POA: Diagnosis not present

## 2022-02-12 DIAGNOSIS — K580 Irritable bowel syndrome with diarrhea: Secondary | ICD-10-CM | POA: Diagnosis not present

## 2022-02-12 DIAGNOSIS — Z0000 Encounter for general adult medical examination without abnormal findings: Secondary | ICD-10-CM | POA: Diagnosis not present

## 2022-02-12 DIAGNOSIS — R252 Cramp and spasm: Secondary | ICD-10-CM | POA: Diagnosis not present

## 2022-02-12 DIAGNOSIS — I10 Essential (primary) hypertension: Secondary | ICD-10-CM | POA: Diagnosis not present

## 2022-02-22 ENCOUNTER — Other Ambulatory Visit: Payer: Self-pay | Admitting: Family Medicine

## 2022-02-22 DIAGNOSIS — Z1231 Encounter for screening mammogram for malignant neoplasm of breast: Secondary | ICD-10-CM

## 2022-02-26 DIAGNOSIS — C44321 Squamous cell carcinoma of skin of nose: Secondary | ICD-10-CM | POA: Diagnosis not present

## 2022-03-19 DIAGNOSIS — E039 Hypothyroidism, unspecified: Secondary | ICD-10-CM | POA: Diagnosis not present

## 2022-03-19 DIAGNOSIS — N179 Acute kidney failure, unspecified: Secondary | ICD-10-CM | POA: Diagnosis not present

## 2022-04-23 DEATH — deceased

## 2022-04-24 ENCOUNTER — Inpatient Hospital Stay: Admission: RE | Admit: 2022-04-24 | Payer: Medicare Other | Source: Ambulatory Visit
